# Patient Record
Sex: Male | Born: 1955 | Race: White | Hispanic: No | Marital: Married
Health system: Southern US, Community
[De-identification: ages and names within clinical notes are randomized; demographics above are authoritative.]

---

## 2015-04-25 ENCOUNTER — Encounter: Payer: Self-pay | Admitting: Sports Medicine

## 2015-04-25 ENCOUNTER — Ambulatory Visit (INDEPENDENT_AMBULATORY_CARE_PROVIDER_SITE_OTHER): Payer: 59 | Admitting: Sports Medicine

## 2015-04-25 DIAGNOSIS — M79671 Pain in right foot: Secondary | ICD-10-CM | POA: Insufficient documentation

## 2015-04-25 DIAGNOSIS — M79672 Pain in left foot: Secondary | ICD-10-CM | POA: Diagnosis not present

## 2015-04-25 NOTE — Progress Notes (Signed)
   Subjective:    I'm seeing this patient as a consultation for:  Dr. Roxy MannsAlexei Radiontchenko  CC: Bilateral foot pain  HPI: This is a pleasant 59 year old male, [place] for hours at a time. For a long time now he's had significant bilateral foot pain, moderate, persistent without radiation. He is here desiring custom orthotics.  Past medical history, Surgical history, Family history not pertinant except as noted below, Social history, Allergies, and medications have been entered into the medical record, reviewed, and no changes needed.   Review of Systems: No headache, visual changes, nausea, vomiting, diarrhea, constipation, dizziness, abdominal pain, skin rash, fevers, chills, night sweats, weight loss, swollen lymph nodes, body aches, joint swelling, muscle aches, chest pain, shortness of breath, mood changes, visual or auditory hallucinations.   Objective:   General: Well Developed, well nourished, and in no acute distress.  Neuro/Psych: Alert and oriented x3, extra-ocular muscles intact, able to move all 4 extremities, sensation grossly intact. Skin: Warm and dry, no rashes noted.  Respiratory: Not using accessory muscles, speaking in full sentences, trachea midline.  Cardiovascular: Pulses palpable, no extremity edema. Abdomen: Does not appear distended. Bilateral feet: No visible erythema or swelling. Range of motion is full in all directions. Strength is 5/5 in all directions. No hallux valgus. No pes cavus or pes planus. No abnormal callus noted. No pain over the navicular prominence, or base of fifth metatarsal. No tenderness to palpation of the calcaneal insertion of plantar fascia. No pain at the Achilles insertion. No pain over the calcaneal bursa. No pain of the retrocalcaneal bursa. No tenderness to palpation over the tarsals, metatarsals, or phalanges. No hallux rigidus or limitus. No tenderness palpation over  interphalangeal joints. No pain with compression of the metatarsal heads. Neurovascularly intact distally.  Patient was fitted for a : standard, cushioned, semi-rigid orthotic. The orthotic was heated and afterward the patient stood on the orthotic blank positioned on the orthotic stand. The patient was positioned in subtalar neutral position and 10 degrees of ankle dorsiflexion in a weight bearing stance. After completion of molding, a stable base was applied to the orthotic blank. The blank was ground to a stable position for weight bearing. Size:13 Base: The [employer] EVA Additional Posting and Padding: None The patient ambulated these, and they were very comfortable.  Impression and Recommendations:   This case required medical decision making of moderate complexity.

## 2015-04-25 NOTE — Assessment & Plan Note (Signed)
Orthotics as above. Return as needed. 

## 2017-07-27 ENCOUNTER — Ambulatory Visit (INDEPENDENT_AMBULATORY_CARE_PROVIDER_SITE_OTHER): Payer: 59

## 2017-07-27 ENCOUNTER — Ambulatory Visit (INDEPENDENT_AMBULATORY_CARE_PROVIDER_SITE_OTHER): Payer: 59 | Admitting: Sports Medicine

## 2017-07-27 DIAGNOSIS — M79671 Pain in right foot: Secondary | ICD-10-CM

## 2017-07-27 DIAGNOSIS — M7741 Metatarsalgia, right foot: Secondary | ICD-10-CM

## 2017-07-27 MED ORDER — MELOXICAM 15 MG PO TABS: ORAL_TABLET | ORAL | 3 refills | Status: AC

## 2017-07-27 NOTE — Assessment & Plan Note (Signed)
Pain over the right fifth metatarsal. Lateral heel wedge and foot wedge placed. Meloxicam, x-rays, return in 3 weeks. He will apply similar lateral wedge into his orthotic for the next couple of weeks.

## 2017-07-27 NOTE — Progress Notes (Signed)
   Subjective:    I'm seeing this patient as a consultation for:  Dr. Roxy Manns Radiontchenko   CC: Right foot pain  HPI: This is a pleasant 61 year old male, he does have a history of a right foot fracture, I'm unsure which bone, he and his wife have been moving, he has started to notice a pain on the plantar aspect of his right fifth mid metatarsal shaft to the MTP. Moderate, persistent without radiation, worse with weightbearing. No trauma.  Past medical history, Surgical history, Family history not pertinant except as noted below, Social history, Allergies, and medications have been entered into the medical record, reviewed, and no changes needed.   Review of Systems: No headache, visual changes, nausea, vomiting, diarrhea, constipation, dizziness, abdominal pain, skin rash, fevers, chills, night sweats, weight loss, swollen lymph nodes, body aches, joint swelling, muscle aches, chest pain, shortness of breath, mood changes, visual or auditory hallucinations.   Objective:   General: Well Developed, well nourished, and in no acute distress.  Neuro:  Extra-ocular muscles intact, able to move all 4 extremities, sensation grossly intact.  Deep tendon reflexes tested were normal. Psych: Alert and oriented, mood congruent with affect. ENT:  Ears and nose appear unremarkable.  Hearing grossly normal. Neck: Unremarkable overall appearance, trachea midline.  No visible thyroid enlargement. Eyes: Conjunctivae and lids appear unremarkable.  Pupils equal and round. Skin: Warm and dry, no rashes noted.  Cardiovascular: Pulses palpable, no extremity edema. Right Foot: No visible erythema or swelling. Range of motion is full in all directions. Strength is 5/5 in all directions. No hallux valgus. No pes cavus or pes planus. No abnormal callus noted. No pain over the navicular prominence, or base of fifth metatarsal. No tenderness to palpation of the calcaneal insertion of plantar fascia. No pain at  the Achilles insertion. No pain over the calcaneal bursa. No pain of the retrocalcaneal bursa. Tender to palpation over the dorsal and plantar aspects of the shaft to distal fifth metatarsal.  Some pain under the MTP consistent with metatarsalgia No hallux rigidus or limitus. No tenderness palpation over interphalangeal joints. No pain with compression of the metatarsal heads. Neurovascularly intact distally.  Lateral wedge placed.  Impression and Recommendations:   This case required medical decision making of moderate complexity.  Metatarsalgia, right foot Pain over the right fifth metatarsal. Lateral heel wedge and foot wedge placed. Meloxicam, x-rays, return in 3 weeks. He will apply similar lateral wedge into his orthotic for the next couple of weeks.

## 2017-08-18 ENCOUNTER — Ambulatory Visit: Payer: 59 | Admitting: Sports Medicine

## 2018-06-26 IMAGING — DX DG FOOT COMPLETE 3+V*R*
3 series · 3 of 3 positions shown · non-contrast
Comparison: None.

CLINICAL DATA: Right foot pain, initial encounter

EXAM:
RIGHT FOOT COMPLETE - 3+ VIEW

[foot ap]
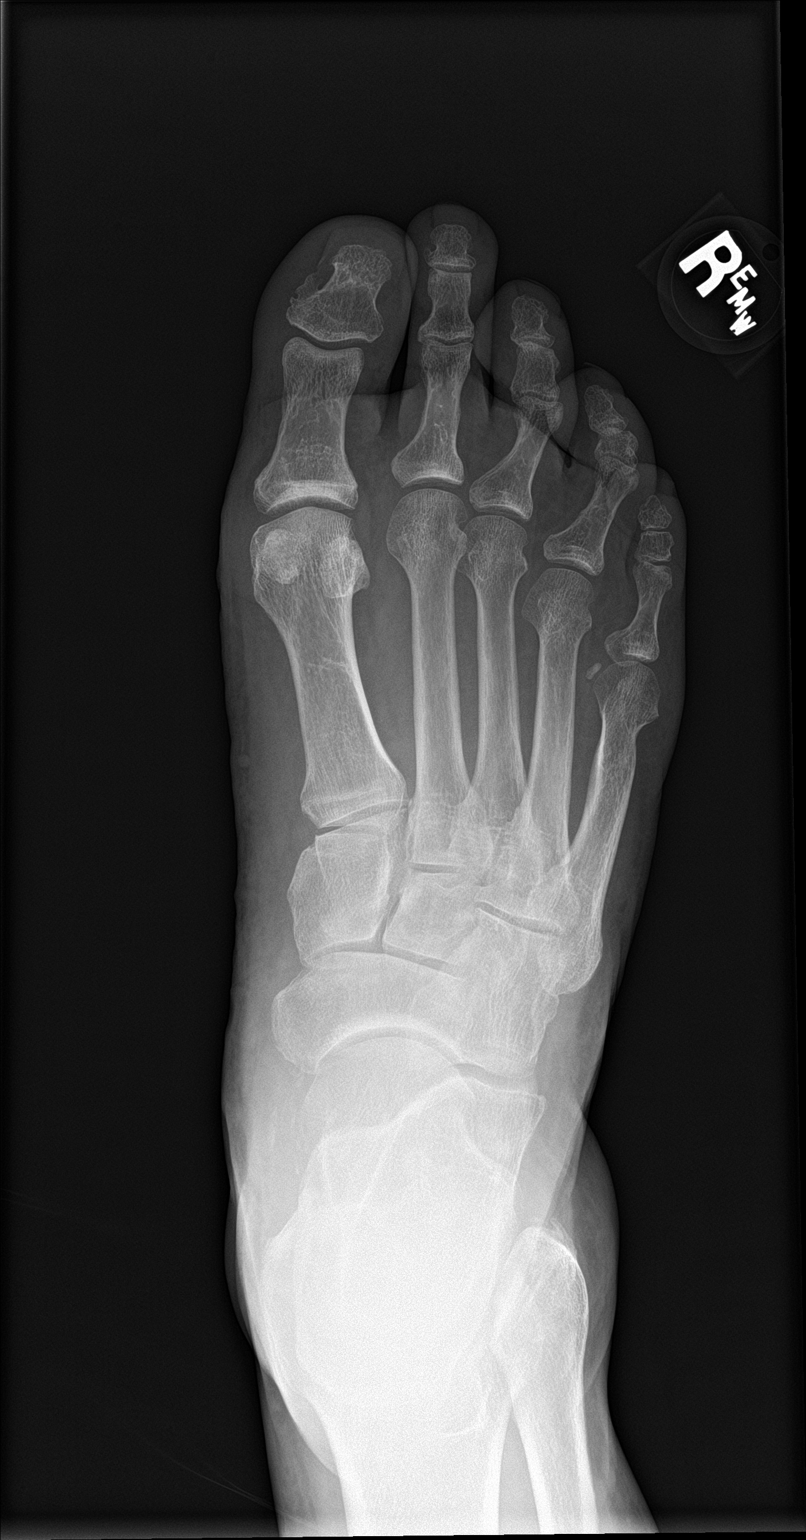

[foot obl]
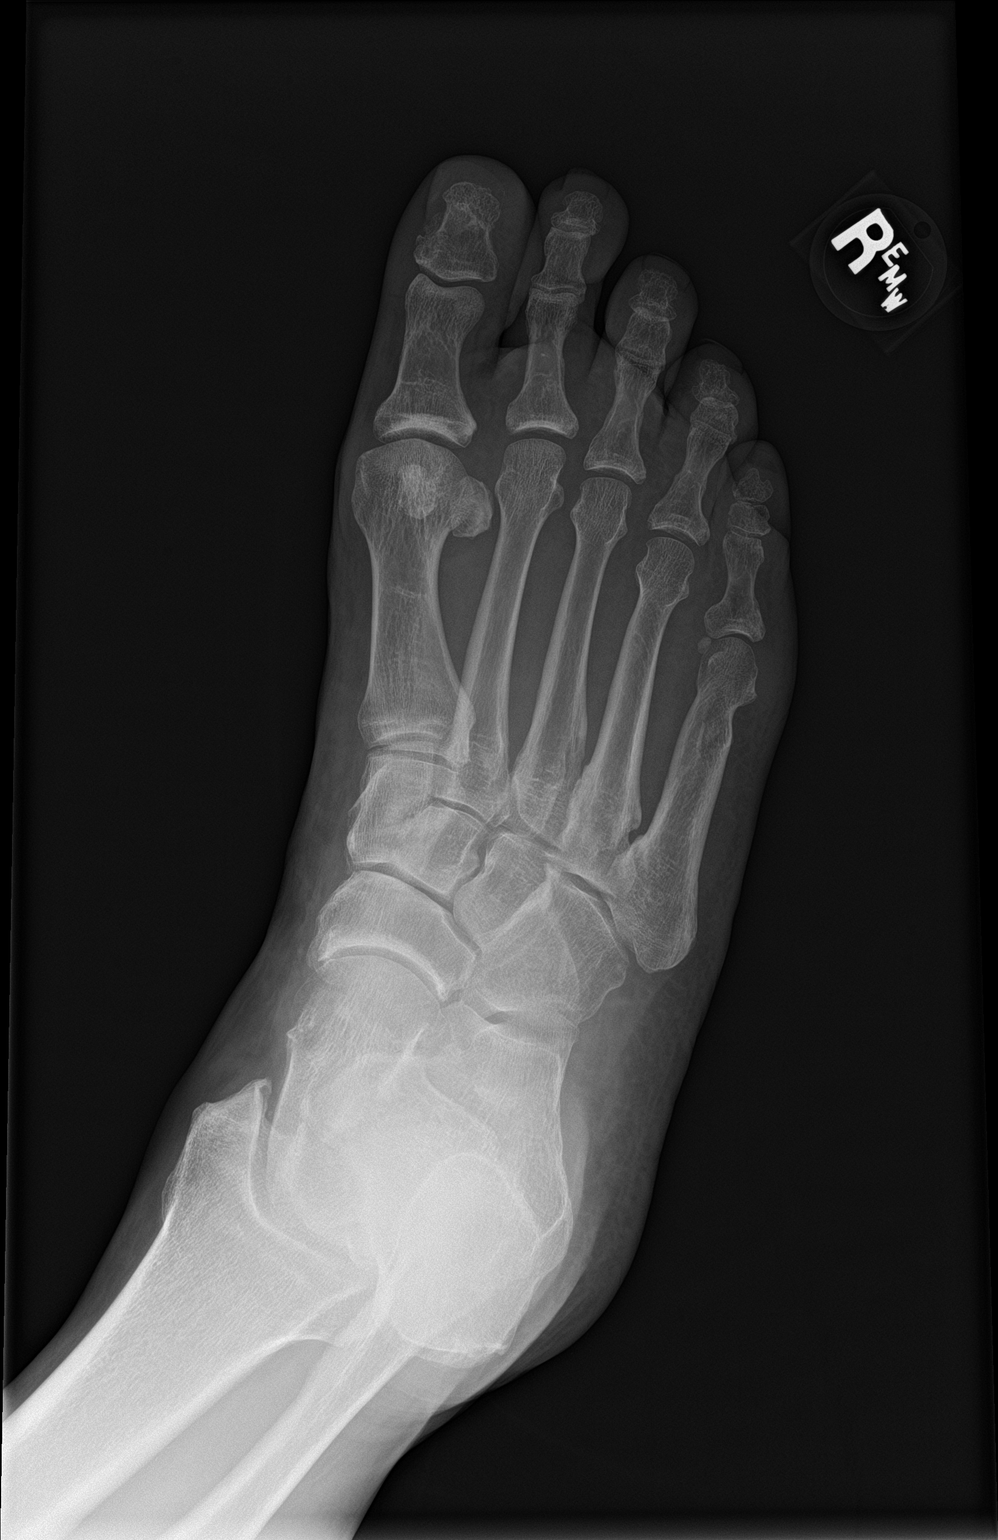

[foot lat]
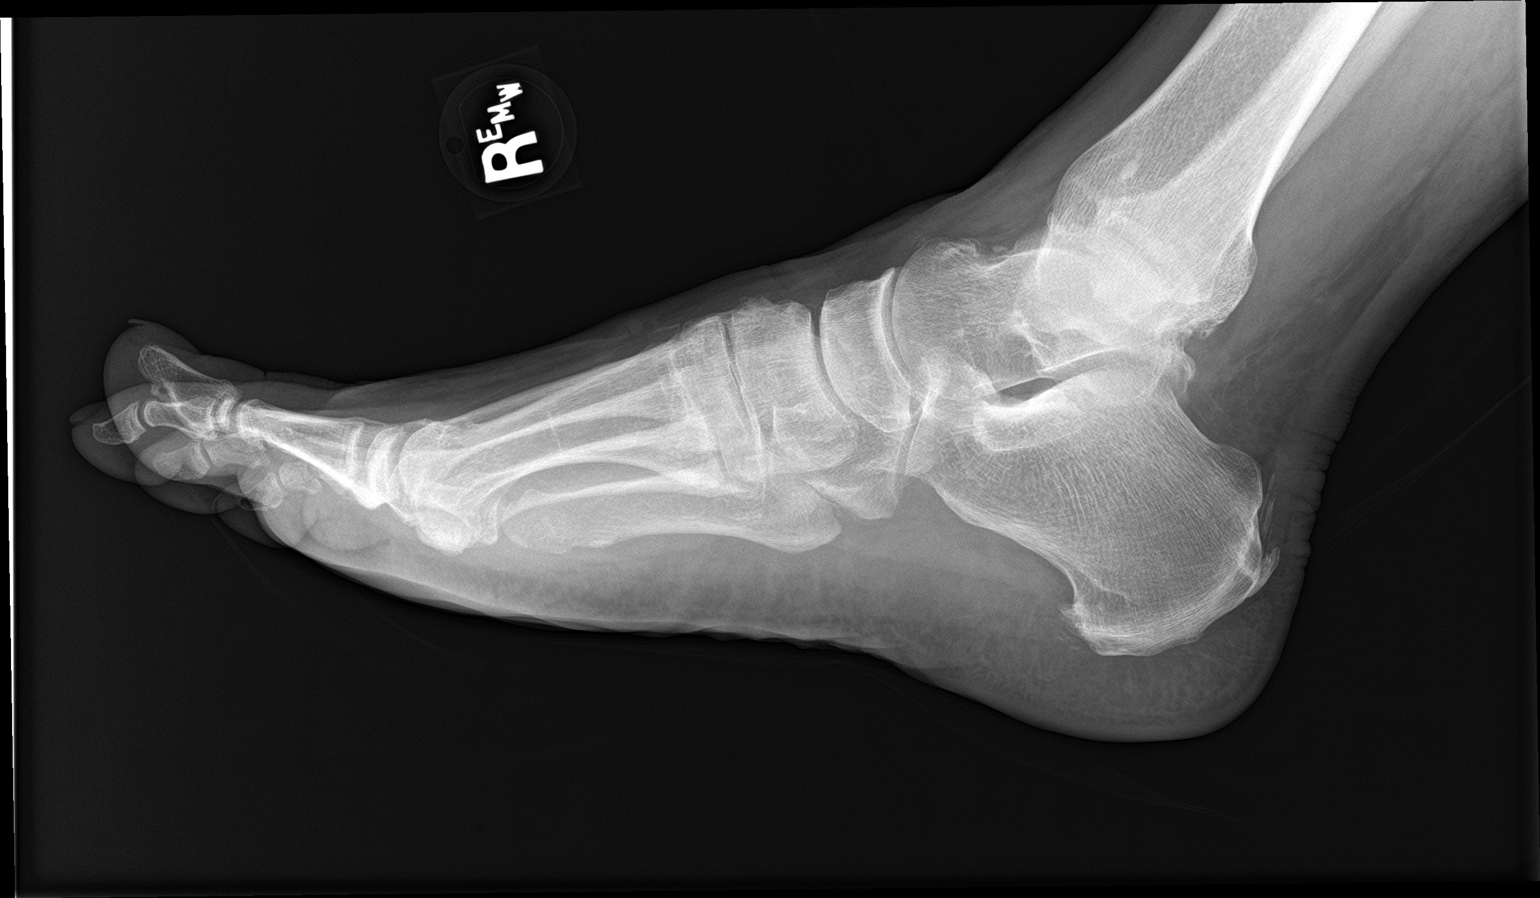

[3 of 3 positions shown; findings below may reference images not displayed]

FINDINGS: Changes consistent with fifth metatarsal fracture with prior healing
are seen. No acute fracture or dislocation is noted. Tarsal
degenerative changes are seen. Calcaneal spurring is noted.
IMPRESSION: Chronic changes without acute abnormality.
